# Patient Record
Sex: Female | Born: 1990 | Race: White | Hispanic: No | Marital: Married | Smoking: Never smoker
Health system: Southern US, Community
[De-identification: ages and names within clinical notes are randomized; demographics above are authoritative.]

## PROBLEM LIST (undated history)

## (undated) DIAGNOSIS — F419 Anxiety disorder, unspecified: Secondary | ICD-10-CM

## (undated) DIAGNOSIS — K219 Gastro-esophageal reflux disease without esophagitis: Secondary | ICD-10-CM

## (undated) HISTORY — PX: TONSILLECTOMY: SUR1361

---

## 2009-07-14 ENCOUNTER — Ambulatory Visit: Payer: Self-pay | Admitting: Otolaryngology

## 2009-07-21 ENCOUNTER — Ambulatory Visit: Payer: Self-pay | Admitting: Otolaryngology

## 2010-05-29 ENCOUNTER — Ambulatory Visit: Payer: Self-pay | Admitting: Internal Medicine

## 2020-10-20 ENCOUNTER — Emergency Department
Admission: EM | Admit: 2020-10-20 | Discharge: 2020-10-20 | Disposition: A | Discharge status: Home / Self Care | Payer: 59 | Attending: Emergency Medicine | Admitting: Emergency Medicine

## 2020-10-20 ENCOUNTER — Encounter: Payer: Self-pay | Admitting: Emergency Medicine

## 2020-10-20 ENCOUNTER — Other Ambulatory Visit: Payer: Self-pay

## 2020-10-20 ENCOUNTER — Emergency Department: Payer: 59

## 2020-10-20 DIAGNOSIS — K8020 Calculus of gallbladder without cholecystitis without obstruction: Secondary | ICD-10-CM | POA: Diagnosis not present

## 2020-10-20 DIAGNOSIS — R1011 Right upper quadrant pain: Secondary | ICD-10-CM

## 2020-10-20 DIAGNOSIS — R0602 Shortness of breath: Secondary | ICD-10-CM | POA: Insufficient documentation

## 2020-10-20 DIAGNOSIS — K219 Gastro-esophageal reflux disease without esophagitis: Secondary | ICD-10-CM | POA: Insufficient documentation

## 2020-10-20 HISTORY — DX: Anxiety disorder, unspecified: F41.9

## 2020-10-20 HISTORY — DX: Gastro-esophageal reflux disease without esophagitis: K21.9

## 2020-10-20 LAB — HEPATIC FUNCTION PANEL
ALT: 122 U/L — ABNORMAL HIGH (ref 0–44)
AST: 208 U/L — ABNORMAL HIGH (ref 15–41)
Albumin: 3.8 g/dL (ref 3.5–5.0)
Alkaline Phosphatase: 71 U/L (ref 38–126)
Bilirubin, Direct: 0.3 mg/dL — ABNORMAL HIGH (ref 0.0–0.2)
Indirect Bilirubin: 0.6 mg/dL (ref 0.3–0.9)
Total Bilirubin: 0.9 mg/dL (ref 0.3–1.2)
Total Protein: 7.3 g/dL (ref 6.5–8.1)

## 2020-10-20 LAB — BASIC METABOLIC PANEL
Anion gap: 13 (ref 5–15)
BUN: 19 mg/dL (ref 6–20)
CO2: 26 mmol/L (ref 22–32)
Calcium: 9.3 mg/dL (ref 8.9–10.3)
Chloride: 99 mmol/L (ref 98–111)
Creatinine, Ser: 0.74 mg/dL (ref 0.44–1.00)
GFR, Estimated: >60 mL/min (ref >60)
Glucose, Bld: 108 mg/dL — ABNORMAL HIGH (ref 70–99)
Potassium: 4 mmol/L (ref 3.5–5.1)
Sodium: 138 mmol/L (ref 135–145)

## 2020-10-20 LAB — LIPASE, BLOOD: Lipase: 43 U/L (ref 11–51)

## 2020-10-20 LAB — TROPONIN I (HIGH SENSITIVITY)
Troponin I (High Sensitivity): 4 ng/L (ref <18)
Troponin I (High Sensitivity): 4 ng/L (ref <18)

## 2020-10-20 LAB — CBC
HCT: 42.4 % (ref 36.0–46.0)
Hemoglobin: 13.7 g/dL (ref 12.0–15.0)
MCH: 27.8 pg (ref 26.0–34.0)
MCHC: 32.3 g/dL (ref 30.0–36.0)
MCV: 86 fL (ref 80.0–100.0)
Platelets: 275 10*3/uL (ref 150–400)
RBC: 4.93 MIL/uL (ref 3.87–5.11)
RDW: 13.4 % (ref 11.5–15.5)
WBC: 9.4 10*3/uL (ref 4.0–10.5)
nRBC: 0 % (ref 0.0–0.2)

## 2020-10-20 LAB — POC URINE PREG, ED: Preg Test, Ur: NEGATIVE

## 2020-10-20 MED ORDER — KETOROLAC TROMETHAMINE 30 MG/ML IJ SOLN
15.0000 mg | Freq: Once | INTRAMUSCULAR | Status: AC
  Administered 2020-10-20: 15 mg via INTRAVENOUS
  Filled 2020-10-20: qty 1

## 2020-10-20 MED ORDER — ONDANSETRON 4 MG PO TBDP: 4.0000 mg | ORAL_TABLET | Freq: Three times a day (TID) | ORAL | 0 refills | Status: AC | PRN

## 2020-10-20 MED ORDER — IOHEXOL 350 MG/ML SOLN
100.0000 mL | Freq: Once | INTRAVENOUS | Status: AC | PRN
  Administered 2020-10-20: 100 mL via INTRAVENOUS

## 2020-10-20 MED ORDER — ONDANSETRON 4 MG PO TBDP
4.0000 mg | ORAL_TABLET | Freq: Once | ORAL | Status: AC | PRN
  Administered 2020-10-20: 4 mg via ORAL
  Filled 2020-10-20: qty 1

## 2020-10-20 MED ORDER — MORPHINE SULFATE (PF) 4 MG/ML IV SOLN
4.0000 mg | Freq: Once | INTRAVENOUS | Status: AC
  Administered 2020-10-20: 4 mg via INTRAVENOUS
  Filled 2020-10-20: qty 1

## 2020-10-20 MED ORDER — IBUPROFEN 800 MG PO TABS: 800.0000 mg | ORAL_TABLET | Freq: Three times a day (TID) | ORAL | 0 refills | Status: AC | PRN

## 2020-10-20 MED ORDER — ONDANSETRON HCL 4 MG/2ML IJ SOLN
4.0000 mg | Freq: Once | INTRAMUSCULAR | Status: AC
  Administered 2020-10-20: 4 mg via INTRAVENOUS
  Filled 2020-10-20: qty 2

## 2020-10-20 NOTE — ED Notes (Signed)
esignature pad not available in subwait at time of discharge

## 2020-10-20 NOTE — ED Triage Notes (Addendum)
Patient states that she had a sudden onset of pain across her whole chest with shortness of breath that started about 45 minutes ago. Patient states that she had similar chest pain recently but did not have the shortness of breath with it. Patient states that she is currently on a prednisone taper and she has never been on prednisone before.

## 2020-10-20 NOTE — ED Notes (Signed)
First rn note: per ems pt with bilateral chest pain under breasts that began tonight, nsr on ekg, vitals: 156/104, 62, 98% pain 7/10. Per ems husband just finished COVID positive qaurentine.

## 2020-10-20 NOTE — ED Notes (Signed)
Patient transported to CT 

## 2020-10-20 NOTE — ED Provider Notes (Signed)
[facility] Emergency Department Provider Note  ____________________________________________  Time seen: Approximately 5:37 AM  I have reviewed the triage vital signs and the nursing notes.   HISTORY  Chief Complaint Chest Pain   HPI Krystal Wells is a 30 y.o. female with a history of GERD and anxiety who presents for evaluation of chest pain or shortness of breath.  Patient reports sudden onset of severe sharp right lower chest/right upper quadrant pain radiating across her chest and between her shoulder blades,  and associated with shortness of breath and nausea.  After receiving morphine in the waiting room patient's pain is now 4 out of 10.  No vomiting.  She has had mild diarrhea over the last few days.  Her husband has had COVID and she is on day 10 of quarantine.  She reports being asymptomatic from [place].  She is vaccinated including a booster shot.  She denies fever cough.  She denies personal or family history of PE or DVT, recent travel immobilization, leg pain or swelling, hemoptysis, or exogenous hormones.  She reports having similar episode of pain last week however that subsided at home and it was not associated with shortness of breath.   Past Medical History:  Diagnosis Date  . Anxiety   . GERD (gastroesophageal reflux disease)     There are no problems to display for this patient.   Past Surgical History:  Procedure Laterality Date  . TONSILLECTOMY      Prior to Admission medications   Not on File    Allergies Patient has no known allergies.  No family history on file.  Social History Social History   Tobacco Use  . Smoking status: Never Smoker  . Smokeless tobacco: Never Used  Substance Use Topics  . Alcohol use: Never  . Drug use: Never    Review of Systems  Constitutional: Negative for fever. Eyes: Negative for visual changes. ENT: Negative for sore throat. Neck: No neck pain  Cardiovascular: + chest  pain. Respiratory: + shortness of breath. Gastrointestinal: Negative for abdominal pain, vomiting. + nausea and diarrhea. Genitourinary: Negative for dysuria. Musculoskeletal: Negative for back pain. Skin: Negative for rash. Neurological: Negative for headaches, weakness or numbness. Psych: No SI or HI  ____________________________________________   PHYSICAL EXAM:  VITAL SIGNS: ED Triage Vitals  Enc Vitals Group     BP 10/20/20 0313 133/67     Pulse Rate 10/20/20 0057 60     Resp 10/20/20 0057 18     Temp 10/20/20 0057 98 F (36.7 C)     Temp src --      SpO2 10/20/20 0057 100 %     Weight 10/20/20 0057 300 lb (136.1 kg)     Height 10/20/20 0057 $RemoveBefor'5\' 2"'jVOaEvdSGAtp$  (1.575 m)     Head Circumference --      Peak Flow --      Pain Score 10/20/20 0057 5     Pain Loc --      Pain Edu? --      Excl. in [place]? --     Constitutional: Alert and oriented. Well appearing and in no apparent distress. HEENT:      Head: Normocephalic and atraumatic.         Eyes: Conjunctivae are normal. Sclera is non-icteric.       Mouth/Throat: Mucous membranes are moist.       Neck: Supple with no signs of meningismus. Cardiovascular: Regular rate and rhythm. No murmurs, gallops, or rubs.  2+ symmetrical distal pulses are present in all extremities. No JVD. Respiratory: Normal respiratory effort. Lungs are clear to auscultation bilaterally. No wheezes, crackles, or rhonchi.  Gastrointestinal: Soft, tender to palpation the right upper quadrant with negative Murphy sign, and non distended with positive bowel sounds. No rebound or guarding. Genitourinary: No CVA tenderness. Musculoskeletal:  No edema, cyanosis, or erythema of extremities. Neurologic: Normal speech and language. Face is symmetric. Moving all extremities. No gross focal neurologic deficits are appreciated. Skin: Skin is warm, dry and intact. No rash noted. Psychiatric: Mood and affect are normal. Speech and behavior are  normal.  ____________________________________________   LABS (all labs ordered are listed, but only abnormal results are displayed)  Labs Reviewed  BASIC METABOLIC PANEL - Abnormal; Notable for the following components:      Result Value   Glucose, Bld 108 (*)    All other components within normal limits  HEPATIC FUNCTION PANEL - Abnormal; Notable for the following components:   AST 208 (*)    ALT 122 (*)    Bilirubin, Direct 0.3 (*)    All other components within normal limits  CBC  LIPASE, BLOOD  POC URINE PREG, ED  TROPONIN I (HIGH SENSITIVITY)  TROPONIN I (HIGH SENSITIVITY)   ____________________________________________  EKG  ED ECG REPORT I, Rudene Re, the attending physician, personally viewed and interpreted this ECG.  Normal sinus rhythm, rate of 60, normal intervals, normal axis, no ST elevations or depressions.  Normal EKG. ____________________________________________  RADIOLOGY  I have personally reviewed the images performed during this visit and I agree with the Radiologist's read.   Interpretation by Radiologist:  DG Chest 2 View  Result Date: 10/20/2020 CLINICAL DATA:  Initial evaluation for acute shortness of breath, chest pain. EXAM: CHEST - 2 VIEW COMPARISON:  None available. FINDINGS: Transverse heart size within normal limits. There is a focal contour abnormality at the right cardiophrenic angle, partially obscuring the right heart border. While this finding is favored to reflect underlying mediastinal fat, a possible focal lesion is not excluded. Mediastinal silhouette otherwise normal. Lungs well inflated. No focal infiltrates. No pulmonary edema or pleural effusion. No pneumothorax. No acute osseous finding. IMPRESSION: Focal contour abnormality at the right cardiophrenic angle, partially obscuring the right heart border. While this finding may reflect underlying mediastinal fat, a possible focal lesion is not excluded. Further evaluation with  dedicated cross-sectional imaging of the chest suggested for further evaluation. Electronically Signed   By: Jeannine Boga M.D.   On: 10/20/2020 01:27   CT Angio Chest PE W and/or Wo Contrast  Result Date: 10/20/2020 CLINICAL DATA:  Sudden onset pain across the whole chest with shortness of breath which began 45 minutes prior EXAM: CT ANGIOGRAPHY CHEST WITH CONTRAST TECHNIQUE: Multidetector CT imaging of the chest was performed using the standard protocol during bolus administration of intravenous contrast. Multiplanar CT image reconstructions and MIPs were obtained to evaluate the vascular anatomy. CONTRAST:  131mL OMNIPAQUE IOHEXOL 350 MG/ML SOLN COMPARISON:  Radiograph 10/20/2020 FINDINGS: Cardiovascular: Satisfactory opacification the pulmonary arteries to the segmental level. No pulmonary artery filling defects are identified. Central pulmonary arteries are normal caliber. Normal heart size. No pericardial effusion. The aortic root is suboptimally assessed given cardiac pulsation artifact. The aorta is normal caliber. No acute luminal abnormality of the imaged aorta. No periaortic stranding or hemorrhage. Proximal great vessels are unremarkable. No major venous abnormalities. Mediastinum/Nodes: Abundant mediastinal fat. No mediastinal fluid or gas. Normal thyroid gland and thoracic inlet. No acute abnormality of  the trachea. Fluid-filled appearance of the thoracic esophagus. No esophageal thickening or paraesophageal stranding. No worrisome mediastinal, hilar or axillary adenopathy. Lungs/Pleura: Atelectatic changes are present dependently. No consolidation, features of edema, pneumothorax, or effusion. No suspicious pulmonary nodules or masses. Upper Abdomen: No acute abnormalities present in the visualized portions of the upper abdomen. Musculoskeletal: No acute osseous abnormality or suspicious osseous lesion. Multilevel degenerative changes are present in the imaged portions of the spine. No  worrisome chest wall lesions. Review of the MIP images confirms the above findings. IMPRESSION: 1. No evidence of pulmonary embolism. 2. Dependent atelectasis.  No other acute intrathoracic process. 3. Fluid-filled appearance of the thoracic esophagus, correlate for symptoms of reflux. Electronically Signed   By: Lovena Le M.D.   On: 10/20/2020 04:50   US ABDOMEN LIMITED RUQ (LIVER/GB)  Result Date: 10/20/2020 CLINICAL DATA:  Right upper quadrant, chest pain EXAM: ULTRASOUND ABDOMEN LIMITED RIGHT UPPER QUADRANT COMPARISON:  CT angiography 10/20/2020 FINDINGS: Gallbladder: Gallbladder contains multiple small echogenic mobile gallstones, largest of which measures up to 6 mm in diameter. Moderate gallbladder distention but without significant gallbladder wall thickening. Sonographic Percell Miller sign is reportedly negative. Common bile duct: Diameter: 4.4 mm, nondilated. Liver: Diffusely increased hepatic echogenicity with loss of definition of the portal triads and diminished posterior through transmission compatible with hepatic steatosis. No visible liver lesions within the limitations of diminished through transmission. No intrahepatic biliary ductal dilatation. Portal vein is patent on color Doppler imaging with normal direction of blood flow towards the liver. Other: Technically challenging exam due to patient body habitus. IMPRESSION: 1. Technically challenging exam due to patient body habitus. 2. Cholelithiasis and moderate gallbladder distention but with a negative sonographic Murphy sign and absent wall thickening or pericholecystic fluid, unlikely to reflect an acute cholecystitis. 3. Hepatic steatosis. Electronically Signed   By: Lovena Le M.D.   On: 10/20/2020 05:38     ____________________________________________   PROCEDURES  Procedure(s) performed:yes .1-3 Lead EKG Interpretation Performed by: Rudene Re, MD Authorized by: Rudene Re, MD     Interpretation: normal      ECG rate assessment: normal     Rhythm: sinus rhythm     Ectopy: none     Critical Care performed:  None ____________________________________________   INITIAL IMPRESSION / ASSESSMENT AND PLAN / ED COURSE  30 y.o. female with a history of GERD and anxiety who presents for evaluation of chest pain or shortness of breath.  Patient is well-appearing and in no distress with normal vital signs, normal work of breathing normal sats, tender to palpation the right upper quadrant with negative Murphy sign.  EKG with no signs of dysrhythmias or ischemia.  Patient with positive COVID exposure 10 days ago.  Ddx PNA, PE, PTX, gallbladder pathology, MSK, pleurisy, pancreatitis, PUD, GERD  Chest x-ray visualized by me and read by radiology as a possible focal abnormality in the right cardiophrenic angle obscuring the right heart border.  Radiologist recommended a CT of the chest which was done.  The CT was visualized by me with no signs of pneumonia, mass, or PE, confirmed by radiology.  Labs showing 2 negative high-sensitivity troponin.  Negative pregnancy test.  Normal lipase.  Mildly elevated LFTs with AST of 208 and ALT of 122 but normal T bili and alk phos.  No white count, normal electrolytes.  Right upper quadrant ultrasound was done showing cholelithiasis with no evidence of cholecystitis.  This is most likely the etiology of patient's pain.  After receiving IV morphine and  Toradol pain is resolved. Patient passed PO challenge. Discussed dietary changes and referral to surgery for evaluation of outpatient, elective cholecystectomy. Discussed my standard return precautions. Old medical records reviewed.          _____________________________________________ Please note:  Patient was evaluated in Emergency Department today for the symptoms described in the history of present illness. Patient was evaluated in the context of the global COVID-19 pandemic, which necessitated consideration that the patient  might be at risk for infection with the SARS-CoV-2 virus that causes COVID-19. Institutional protocols and algorithms that pertain to the evaluation of patients at risk for COVID-19 are in a state of rapid change based on information released by regulatory bodies including the CDC and federal and state organizations. These policies and algorithms were followed during the patient's care in the ED.  Some ED evaluations and interventions may be delayed as a result of limited staffing during the pandemic.   [place] Controlled Substance Database was reviewed by me. ____________________________________________   FINAL CLINICAL IMPRESSION(S) / ED DIAGNOSES   Final diagnoses:  RUQ abdominal pain  Calculus of gallbladder without cholecystitis without obstruction      NEW MEDICATIONS STARTED DURING THIS VISIT:  ED Discharge Orders    None       Note:  This document was prepared using Dragon voice recognition software and may include unintentional dictation errors.    Rudene Re, MD 10/20/20 [phone]

## 2022-05-28 IMAGING — CR DG CHEST 2V
1 series · 2 of 2 positions shown · non-contrast
Comparison: None available.

CLINICAL DATA: Initial evaluation for acute shortness of breath,
chest pain.

EXAM:
CHEST - 2 VIEW

[Series 1: dg chest 2 view · 0.14mm/px · 2 of 2 slices shown]
[im 1/2]
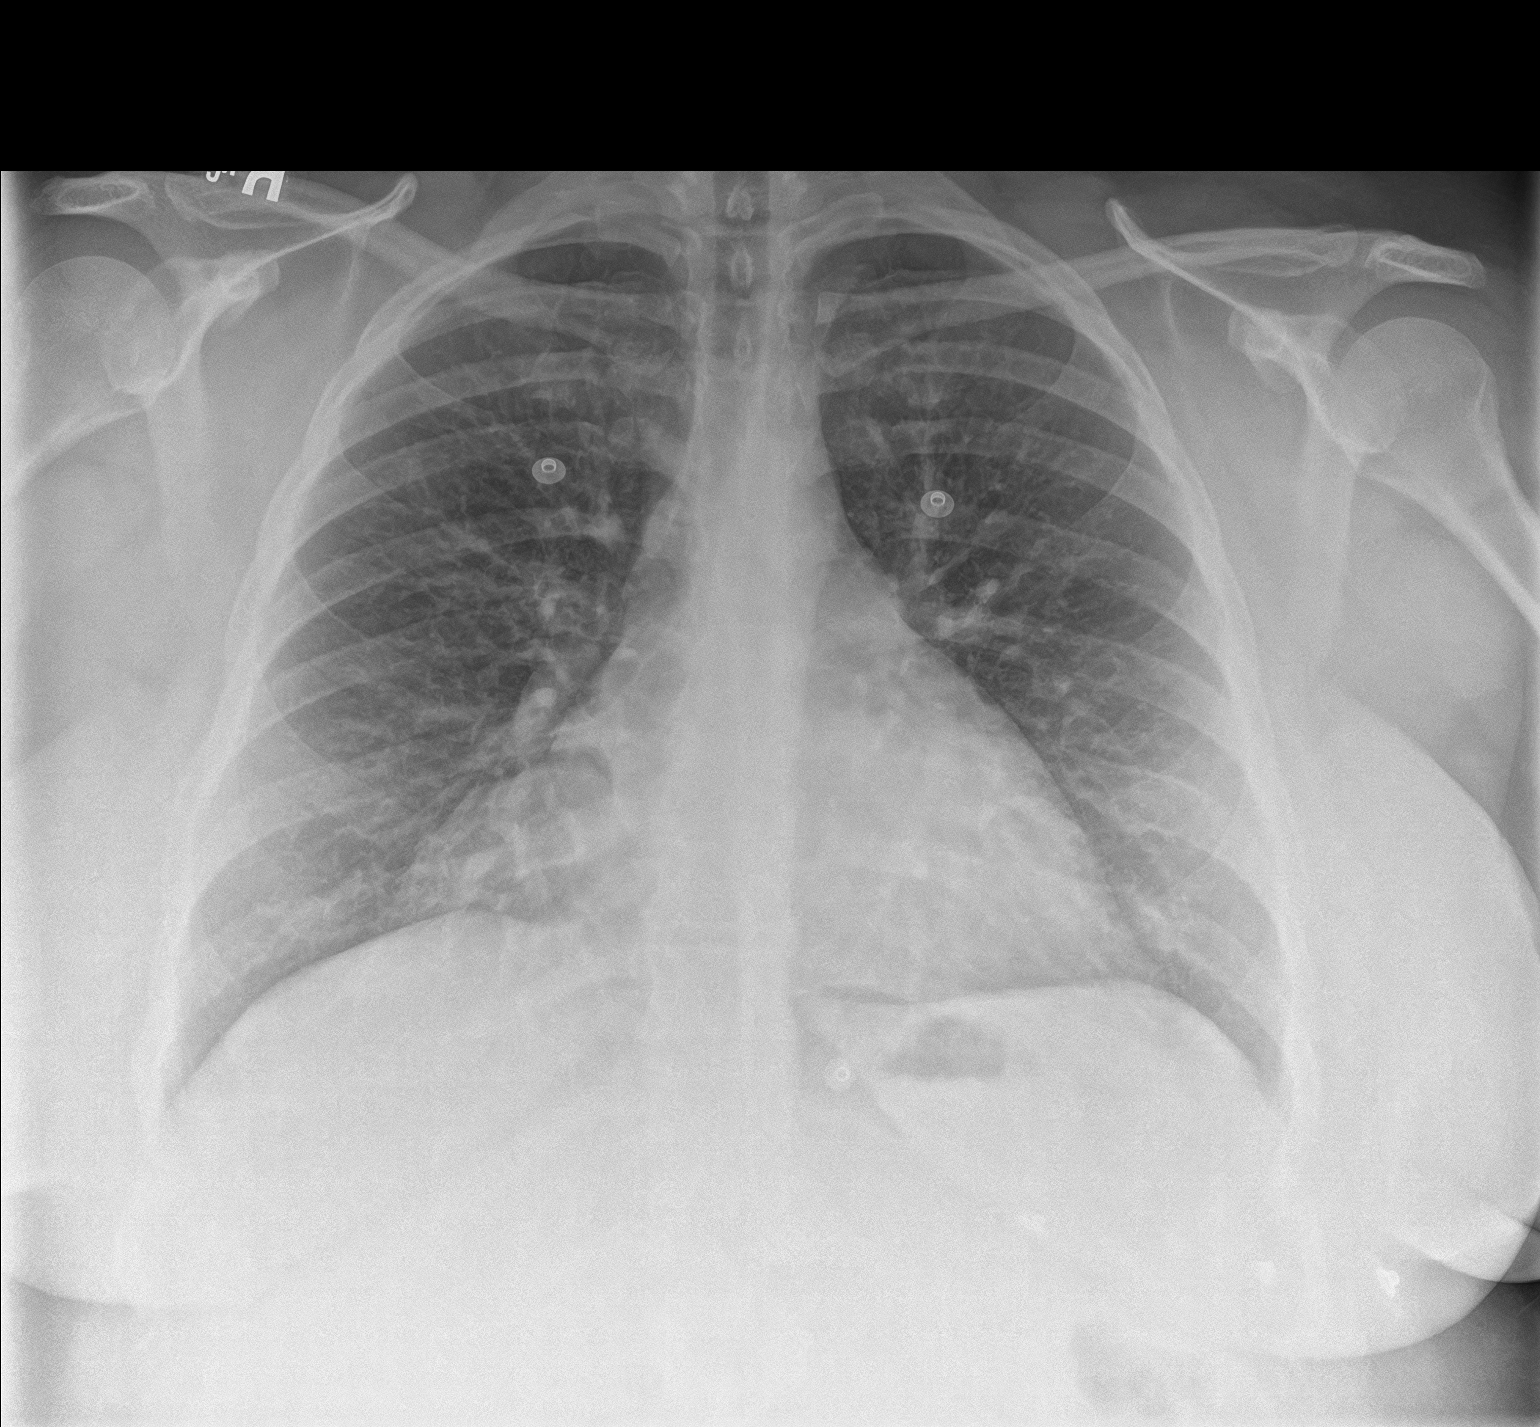
[im 2/2]
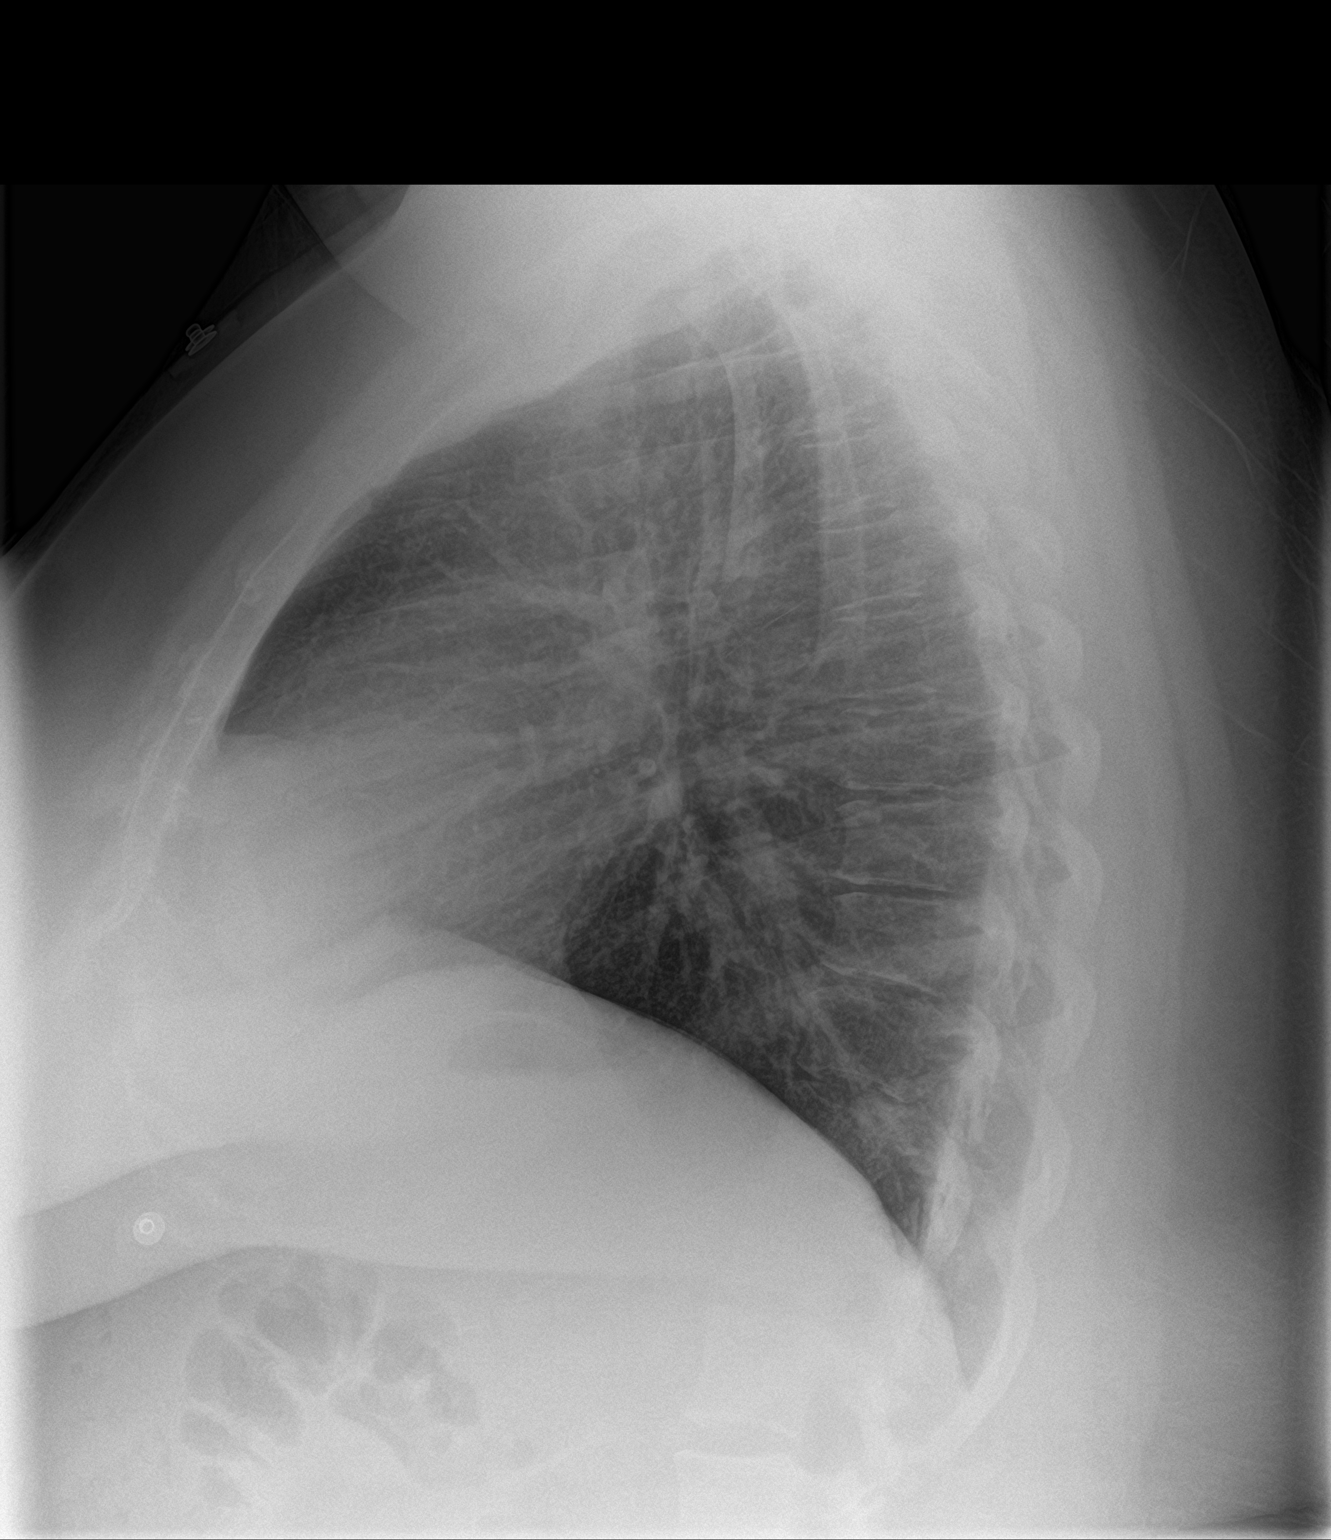

[2 of 2 positions shown; findings below may reference images not displayed]

FINDINGS: Transverse heart size within normal limits. There is a focal contour
abnormality at the right cardiophrenic angle, partially obscuring
the right heart border. While this finding is favored to reflect
underlying mediastinal fat, a possible focal lesion is not excluded.
Mediastinal silhouette otherwise normal.

Lungs well inflated. No focal infiltrates. No pulmonary edema or
pleural effusion. No pneumothorax.

No acute osseous finding.
IMPRESSION: Focal contour abnormality at the right cardiophrenic angle,
partially obscuring the right heart border. While this finding may
reflect underlying mediastinal fat, a possible focal lesion is not
excluded. Further evaluation with dedicated cross-sectional imaging
of the chest suggested for further evaluation.
